# Patient Record
Sex: Male | Born: 1935 | Race: White | Hispanic: No | Marital: Married
Health system: Southern US, Community
[De-identification: ages and names within clinical notes are randomized; demographics above are authoritative.]

---

## 2005-08-27 ENCOUNTER — Ambulatory Visit: Payer: Self-pay | Admitting: Internal Medicine

## 2007-07-25 ENCOUNTER — Emergency Department: Payer: Self-pay | Admitting: Emergency Medicine

## 2007-07-25 ENCOUNTER — Other Ambulatory Visit: Payer: Self-pay

## 2007-10-26 ENCOUNTER — Ambulatory Visit: Payer: Self-pay | Admitting: Urology

## 2009-01-10 IMAGING — CT CT HEAD WITHOUT CONTRAST
1 of 3 series · 15 of 30 positions shown, 19 images · non-contrast
Comparison: none

REASON FOR EXAM: headache elevated bp
COMMENTS:

PROCEDURE:     CT  - CT HEAD WITHOUT CONTRAST  - July 25, 2007  [DATE]
RESULT:     Comparison: No available comparison exam.
Procedure: CT examination of the head was performed without intravenous
contrast. Collimation is 5 mm.

[Series 4: without · axial · non-contrast · 0.42mm/px · z∈[+237,+373]mm · 15 of 78 slices shown, 19 images]
[im 5/78  brain]
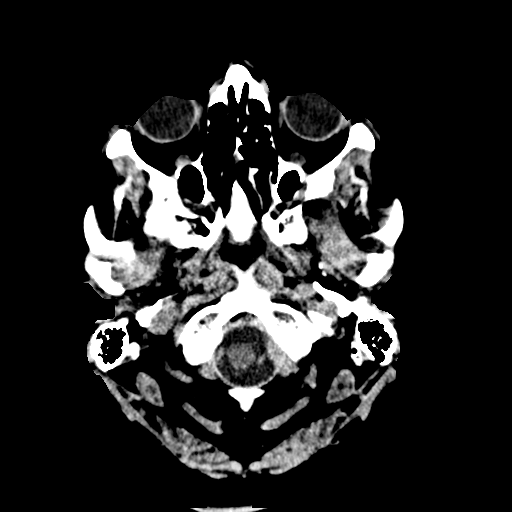
[im 5/78  bone]
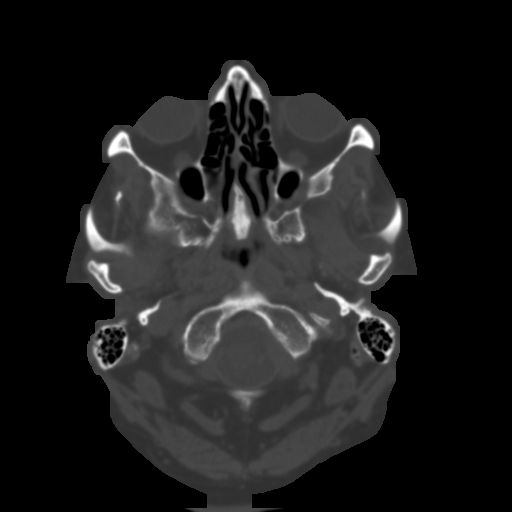
[im 10/78  brain]
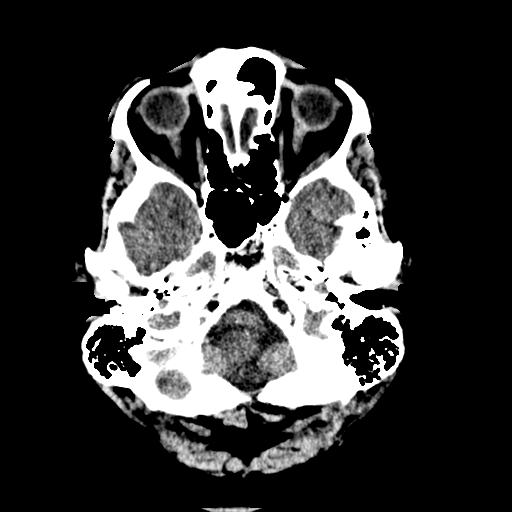
[im 15/78  brain]
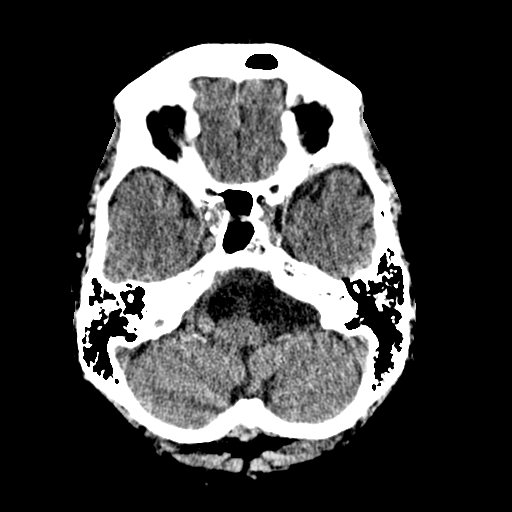
[im 20/78  brain]
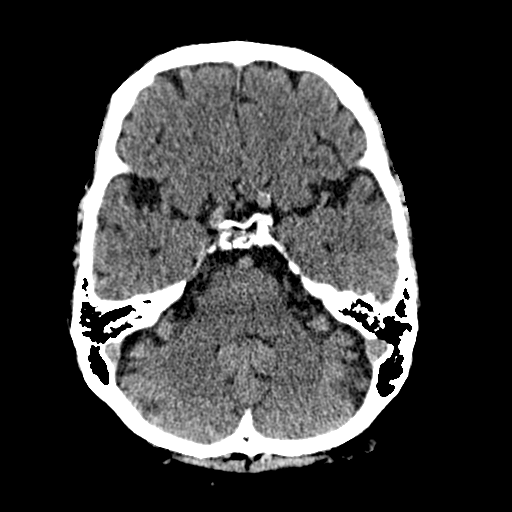
[im 25/78  brain]
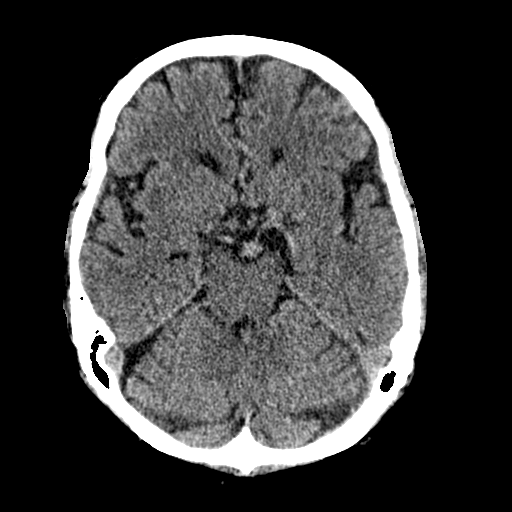
[im 25/78  bone]
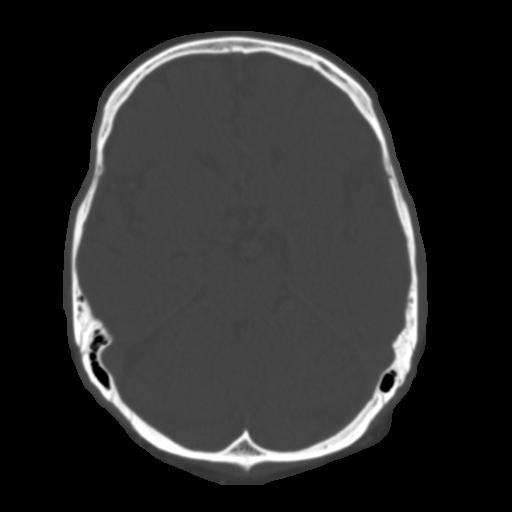
[im 29/78  brain]
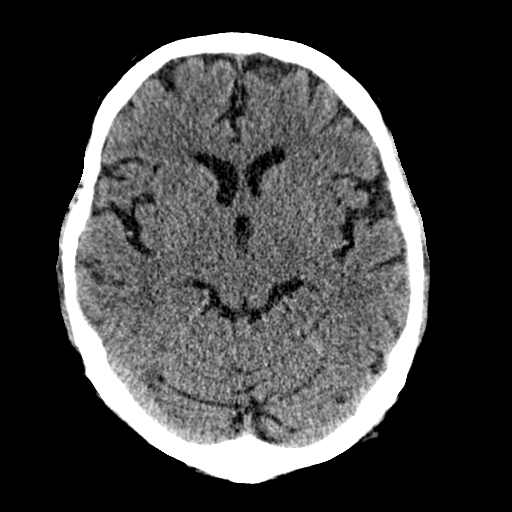
[im 34/78  brain]
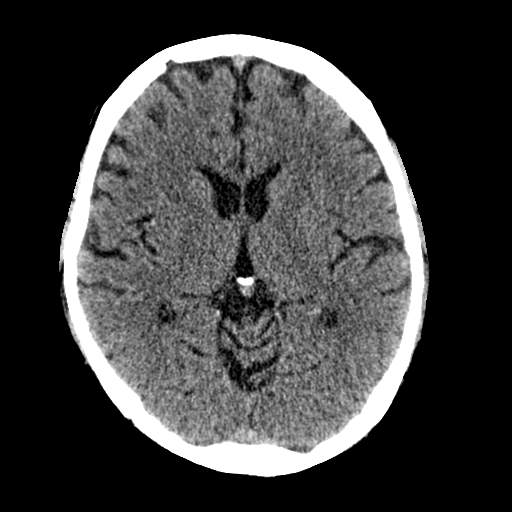
[im 39/78  brain]
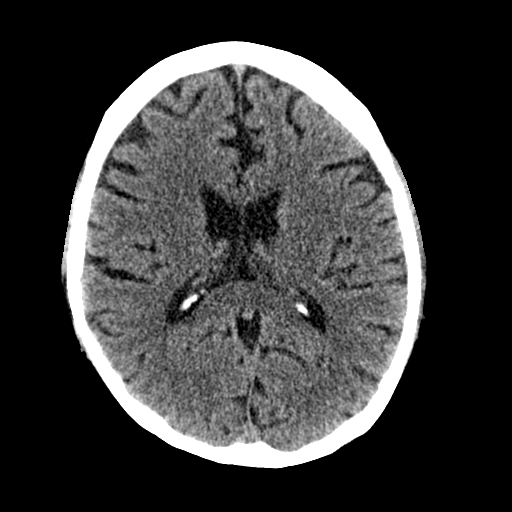
[im 44/78  brain]
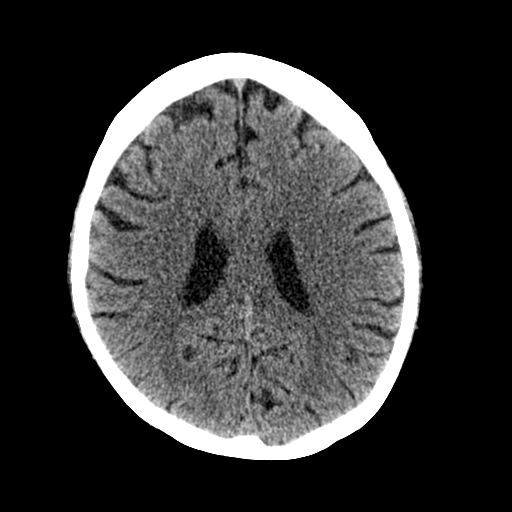
[im 44/78  bone]
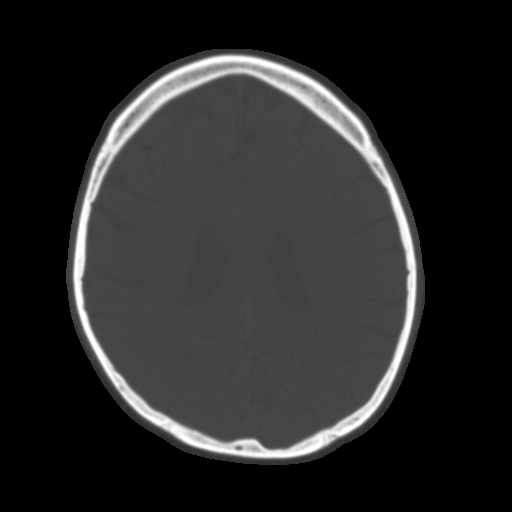
[im 49/78  brain]
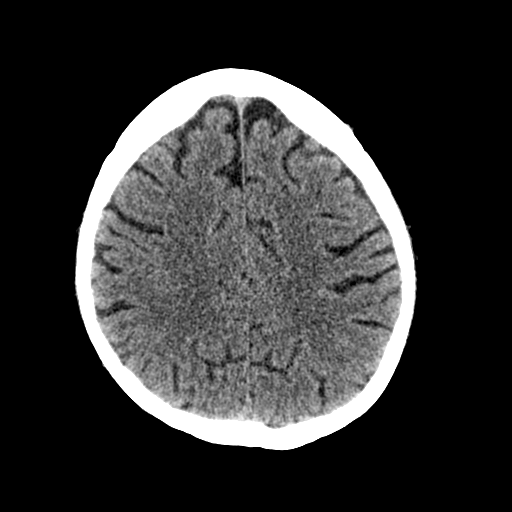
[im 53/78  brain]
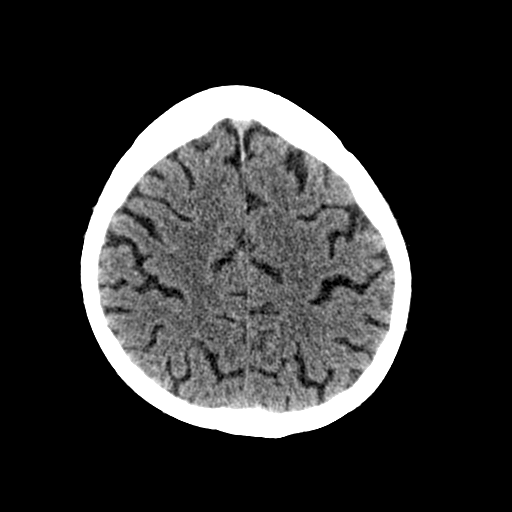
[im 58/78  brain]
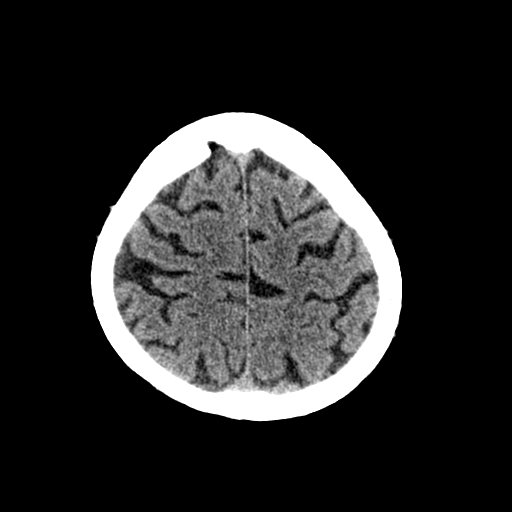
[im 63/78  brain]
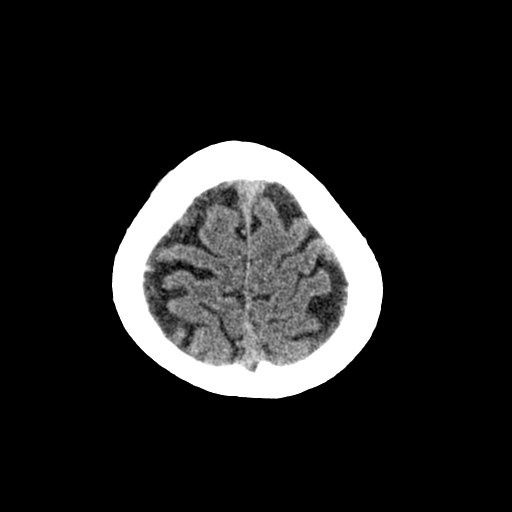
[im 63/78  bone]
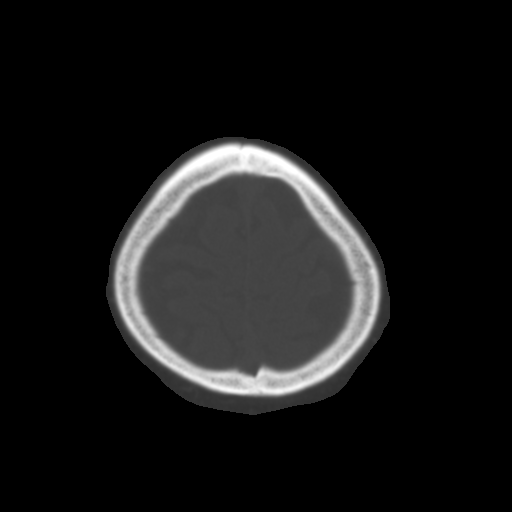
[im 68/78  brain]
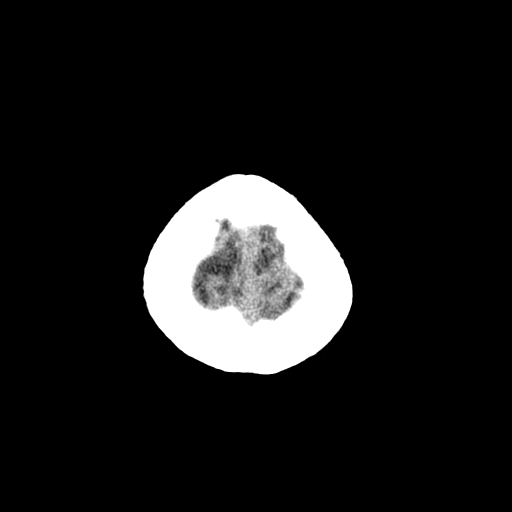
[im 73/78  brain]
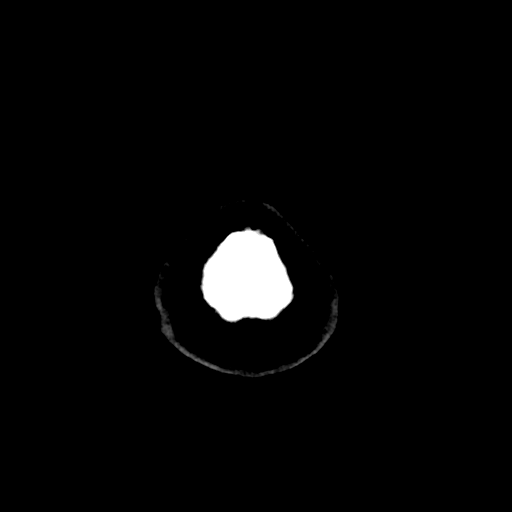

[15 of 30 positions shown; findings below may reference images not displayed]

FINDINGS: No evidence of intracranial hemorrhage, mass-effect, or ventricular
dilatation. The gray and white matters are differentiated. No displaced
calvarial fracture is noted. The partially visualized paranasal sinuses and
mastoid air cells are unremarkable.
IMPRESSION: 1. Unremarkable noncontrast CT of the head for age.

Preliminary report was faxed to the emergency room by the night radiologist
shortly after the study was performed.

## 2009-02-22 ENCOUNTER — Emergency Department: Payer: Self-pay | Admitting: Emergency Medicine

## 2010-08-11 IMAGING — CT CT ABD-PELV W/O CM
1 of 2 series · 15 of 32 positions shown, 19 images · non-contrast
Comparison: none

REASON FOR EXAM: (1) abd pain, vomiting, not tolerating PO, Elevated GFR;
(2) abd pain, vomiting,
COMMENTS:

[Series 2: abd/pelvis · axial · 0.73mm/px · z∈[+120,+550]mm · 15 of 161 slices shown, 19 images]
[im 12/161  soft-tissue]
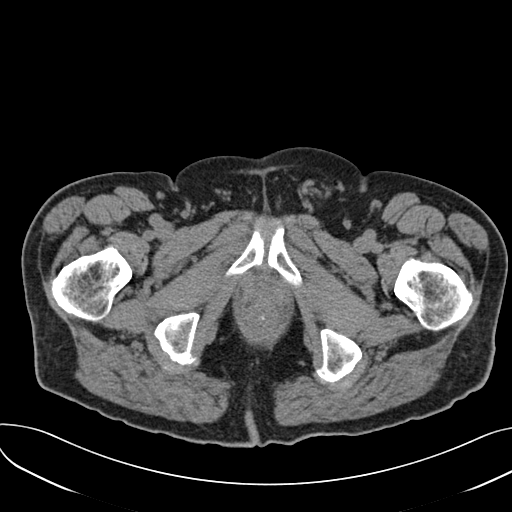
[im 12/161  bone]
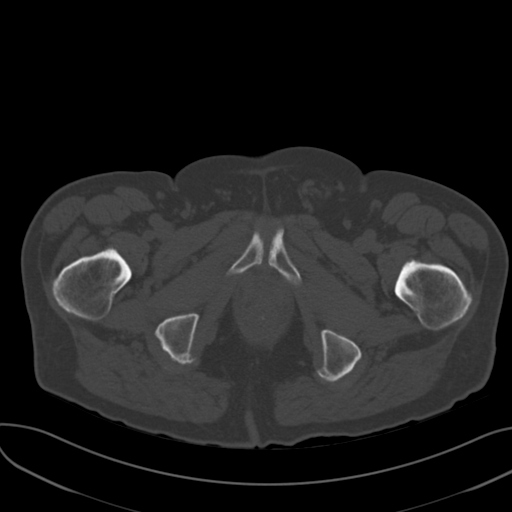
[im 23/161  soft-tissue]
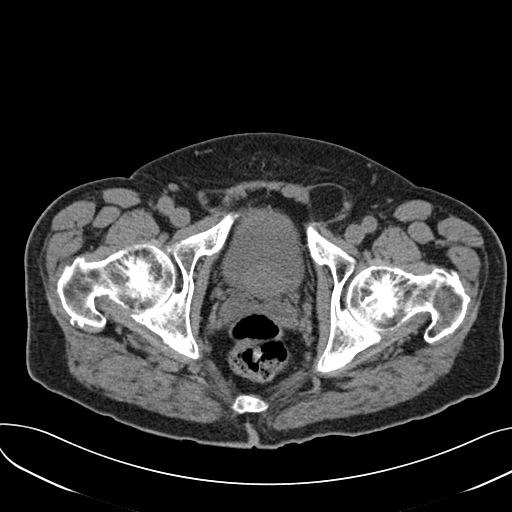
[im 35/161  soft-tissue]
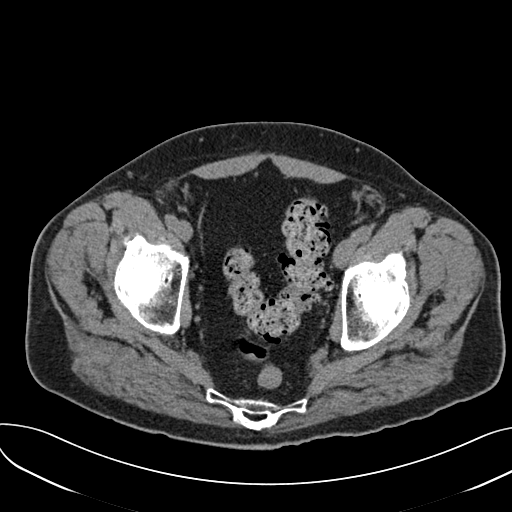
[im 46/161  soft-tissue]
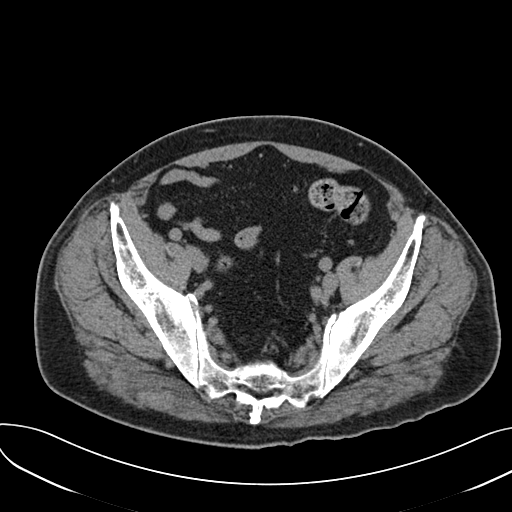
[im 58/161  soft-tissue]
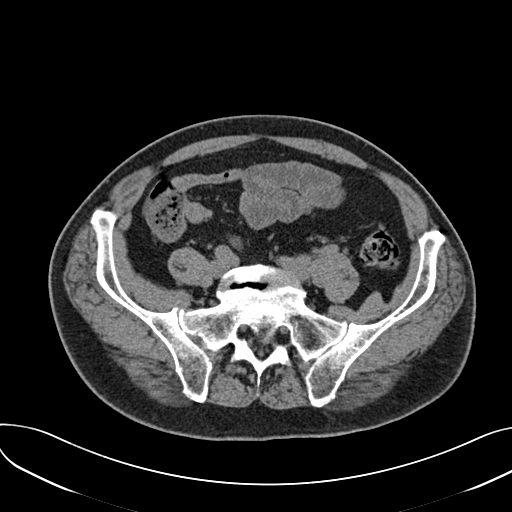
[im 69/161  soft-tissue]
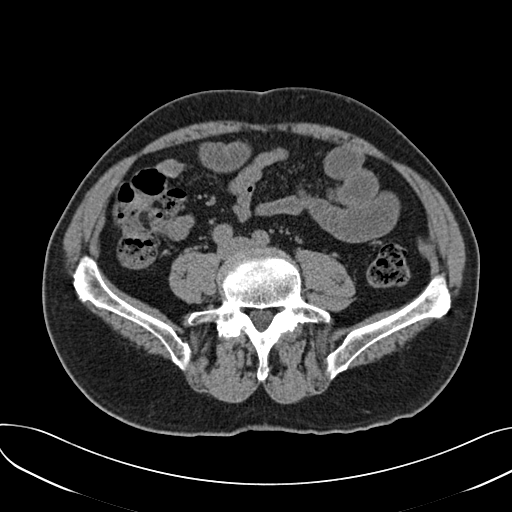
[im 81/161  soft-tissue]
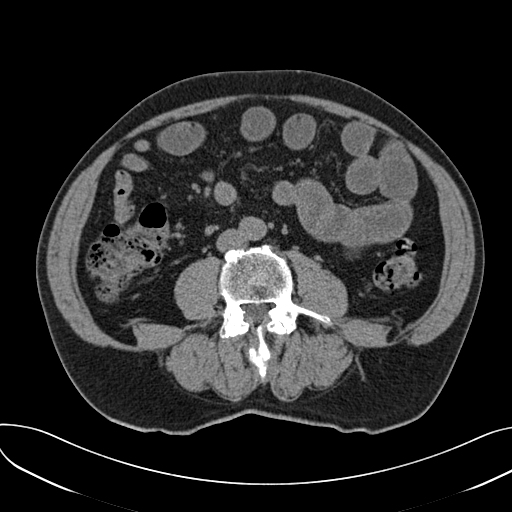
[im 92/161  soft-tissue]
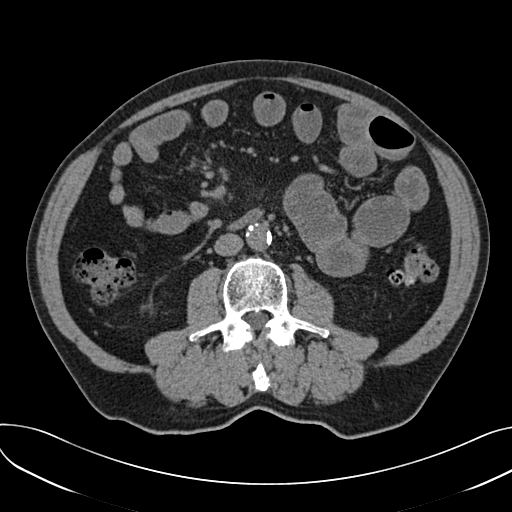
[im 103/161  soft-tissue]
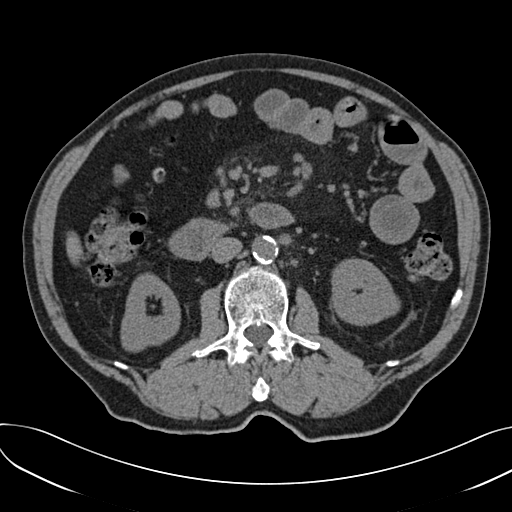
[im 103/161  bone]
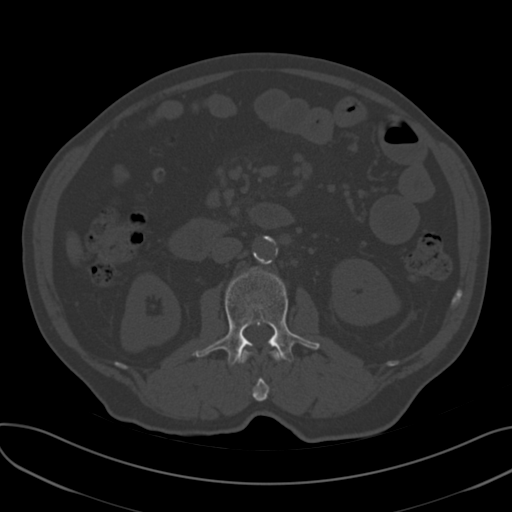
[im 115/161  soft-tissue]
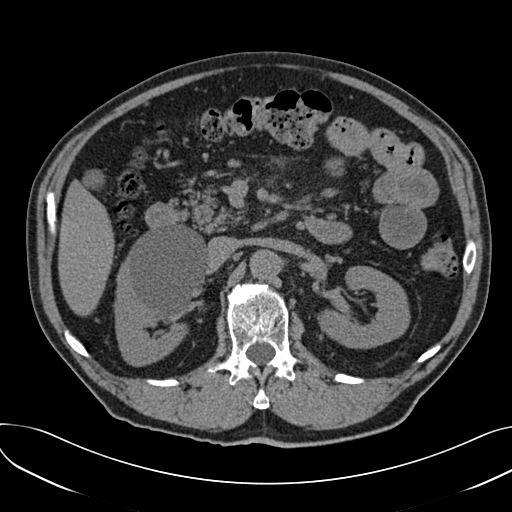
[im 126/161  soft-tissue]
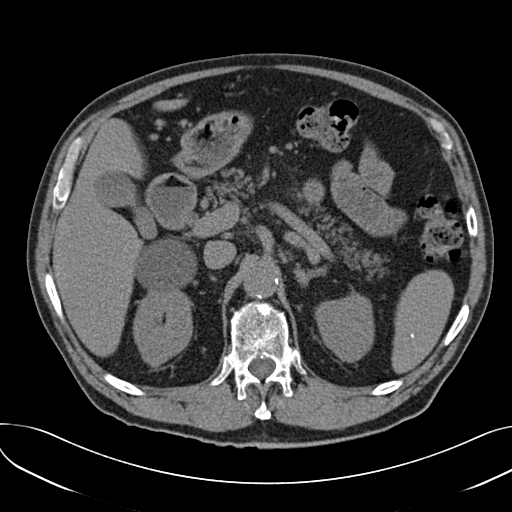
[im 138/161  soft-tissue]
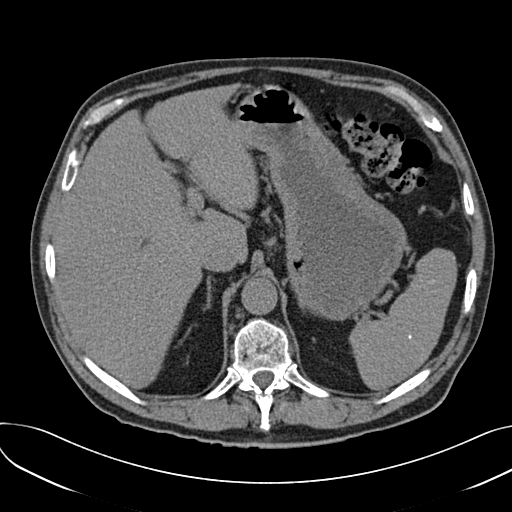
[im 138/161  lung]
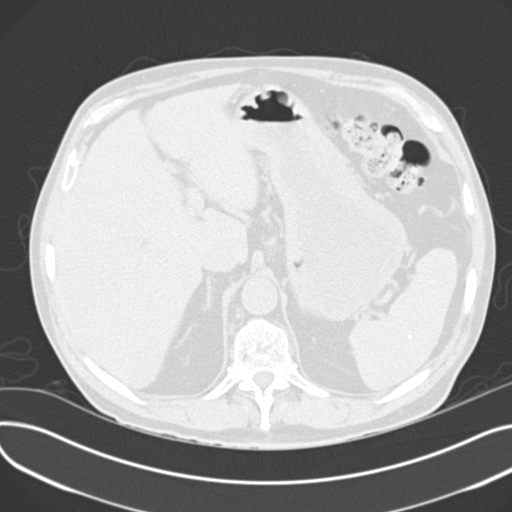
[im 143/161  lung]
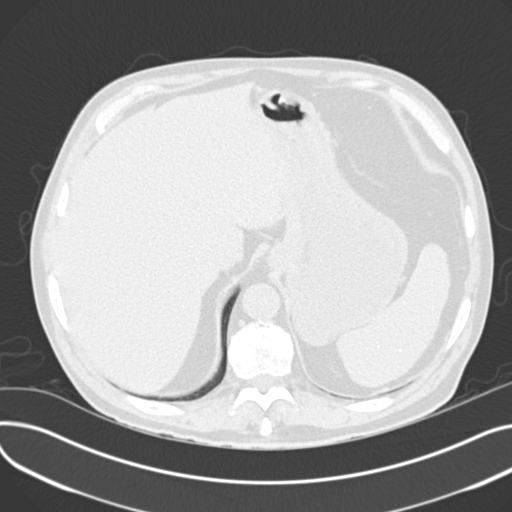
[im 149/161  soft-tissue]
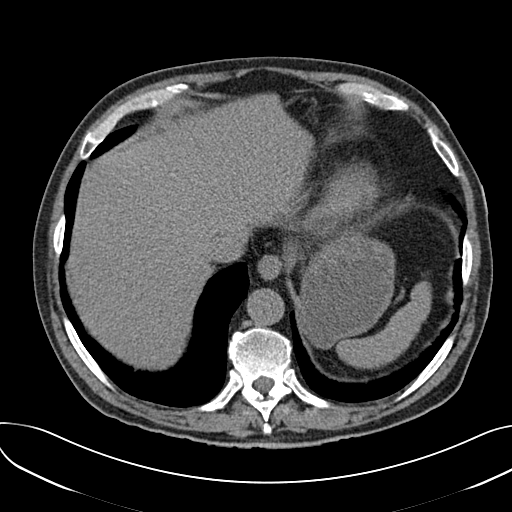
[im 149/161  lung]
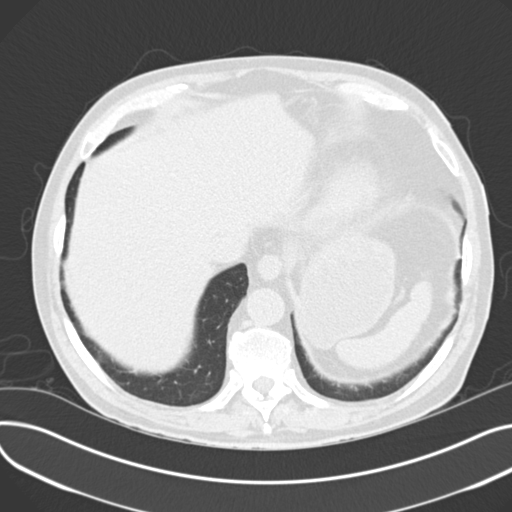
[im 155/161  lung]
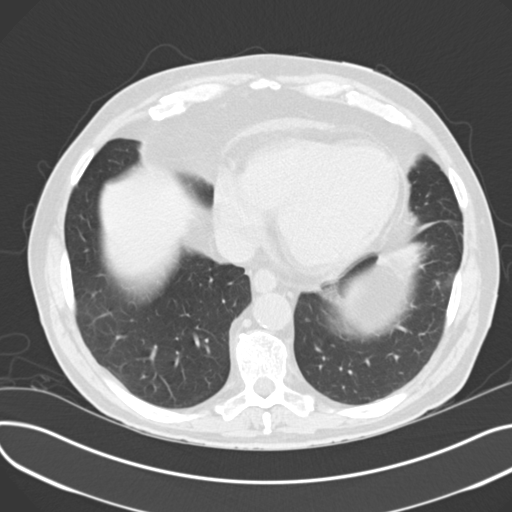

[15 of 32 positions shown; findings below may reference images not displayed]

PROCEDURE:     CT  - CT ABDOMEN AND PELVIS W[DATE]  [DATE]

RESULT:     Helical, noncontrast 3 mm sections were obtained from the lung
bases through the pubic symphysis.

Evaluation of the lung bases demonstrates no gross abnormalities.

Within the limitations of a noncontrast CT, the liver is unremarkable.
Multiple calcified densities project within the spleen. Incidental note is
made of a calcified granuloma within the base of the right lung. Evaluation
of the right and left kidneys demonstrates no evidence of hydronephrosis. A
nonobstructing, 2 mm calculus is identified within the left kidney. A large,
right renal cyst is appreciated. There is no evidence of hydronephrosis or
hydroureter. Multiple distended loops of small bowel are identified within
the left upper and mid abdomen. There is no CT evidence of colitis or
diverticulitis or appendicitis. There is no evidence of abdominal or pelvic
free fluid, drainable loculated fluid collections, masses or adenopathy.
IMPRESSION: 1.     Focus ileus versus possible focal enteritis.
2.     No evidence of hydronephrosis.  There is a nonobstructing, 2 mm
calculus, in the left kidney.
3.     Note, not mentioned above, a 6.9 mm, nonobstructing medullary
calculus is identified within the right kidney.
4.     There are also findings likely representing a right renal cyst.
5.     Granulomatous changes within the spleen.
6.     Dr. Descartes of the Emergency Department was informed of these
findings via preliminary faxed report on 02/22/2009 at [DATE], [facility]
Standard Time.

## 2014-02-14 ENCOUNTER — Ambulatory Visit: Payer: Self-pay | Admitting: Physician Assistant

## 2014-02-14 LAB — RAPID STREP-A WITH REFLX: MICRO TEXT REPORT: NEGATIVE

## 2014-02-17 LAB — BETA STREP CULTURE(ARMC)

## 2014-03-12 DEATH — deceased

## 2014-03-17 ENCOUNTER — Telehealth: Payer: Self-pay

## 2014-03-17 NOTE — Telephone Encounter (Signed)
Patient died @ [facility] per Obituary °
